# Patient Record
Sex: Female | Born: 1960 | Race: Black or African American | Hispanic: No | Marital: Single | Smoking: Former smoker
Health system: Southern US, Community
[De-identification: ages and names within clinical notes are randomized; demographics above are authoritative.]

## PROBLEM LIST (undated history)

## (undated) DIAGNOSIS — D75A Glucose-6-phosphate dehydrogenase (G6PD) deficiency without anemia: Secondary | ICD-10-CM

## (undated) HISTORY — PX: TUBAL LIGATION: SHX77

## (undated) HISTORY — PX: SINUS IRRIGATION: SHX2411

---

## 2013-05-26 ENCOUNTER — Encounter (HOSPITAL_BASED_OUTPATIENT_CLINIC_OR_DEPARTMENT_OTHER): Payer: Self-pay | Admitting: Emergency Medicine

## 2013-05-26 ENCOUNTER — Emergency Department (HOSPITAL_BASED_OUTPATIENT_CLINIC_OR_DEPARTMENT_OTHER)
Admission: EM | Admit: 2013-05-26 | Discharge: 2013-05-26 | Disposition: A | Discharge status: Home / Self Care | Payer: BC Managed Care – PPO | Attending: Emergency Medicine | Admitting: Emergency Medicine

## 2013-05-26 DIAGNOSIS — Z8639 Personal history of other endocrine, nutritional and metabolic disease: Secondary | ICD-10-CM | POA: Insufficient documentation

## 2013-05-26 DIAGNOSIS — K2990 Gastroduodenitis, unspecified, without bleeding: Principal | ICD-10-CM

## 2013-05-26 DIAGNOSIS — Z862 Personal history of diseases of the blood and blood-forming organs and certain disorders involving the immune mechanism: Secondary | ICD-10-CM | POA: Insufficient documentation

## 2013-05-26 DIAGNOSIS — Z3202 Encounter for pregnancy test, result negative: Secondary | ICD-10-CM | POA: Insufficient documentation

## 2013-05-26 DIAGNOSIS — Z79899 Other long term (current) drug therapy: Secondary | ICD-10-CM | POA: Insufficient documentation

## 2013-05-26 DIAGNOSIS — K2970 Gastritis, unspecified, without bleeding: Secondary | ICD-10-CM | POA: Insufficient documentation

## 2013-05-26 HISTORY — DX: Glucose-6-phosphate dehydrogenase (G6PD) deficiency without anemia: D75.A

## 2013-05-26 LAB — CBC WITH DIFFERENTIAL/PLATELET
Basophils Absolute: 0 10*3/uL (ref 0.0–0.1)
Basophils Relative: 0 % (ref 0–1)
Eosinophils Absolute: 0.1 10*3/uL (ref 0.0–0.7)
Eosinophils Relative: 1 % (ref 0–5)
HCT: 34.8 % — ABNORMAL LOW (ref 36.0–46.0)
HEMOGLOBIN: 11.2 g/dL — AB (ref 12.0–15.0)
LYMPHS ABS: 2.8 10*3/uL (ref 0.7–4.0)
LYMPHS PCT: 40 % (ref 12–46)
MCH: 27.7 pg (ref 26.0–34.0)
MCHC: 32.2 g/dL (ref 30.0–36.0)
MCV: 85.9 fL (ref 78.0–100.0)
MONOS PCT: 7 % (ref 3–12)
Monocytes Absolute: 0.5 10*3/uL (ref 0.1–1.0)
NEUTROS PCT: 52 % (ref 43–77)
Neutro Abs: 3.5 10*3/uL (ref 1.7–7.7)
Platelets: 315 10*3/uL (ref 150–400)
RBC: 4.05 MIL/uL (ref 3.87–5.11)
RDW: 13.4 % (ref 11.5–15.5)
WBC: 6.8 10*3/uL (ref 4.0–10.5)

## 2013-05-26 LAB — LIPASE, BLOOD: LIPASE: 27 U/L (ref 11–59)

## 2013-05-26 LAB — URINE MICROSCOPIC-ADD ON

## 2013-05-26 LAB — URINALYSIS, ROUTINE W REFLEX MICROSCOPIC
GLUCOSE, UA: NEGATIVE mg/dL
Hgb urine dipstick: NEGATIVE
Ketones, ur: NEGATIVE mg/dL
NITRITE: NEGATIVE
PH: 6 (ref 5.0–8.0)
Protein, ur: NEGATIVE mg/dL
SPECIFIC GRAVITY, URINE: 1.028 (ref 1.005–1.030)
Urobilinogen, UA: 1 mg/dL (ref 0.0–1.0)

## 2013-05-26 LAB — COMPREHENSIVE METABOLIC PANEL
ALK PHOS: 167 U/L — AB (ref 39–117)
ALT: 36 U/L — ABNORMAL HIGH (ref 0–35)
AST: 26 U/L (ref 0–37)
Albumin: 3.9 g/dL (ref 3.5–5.2)
BUN: 10 mg/dL (ref 6–23)
CO2: 26 mEq/L (ref 19–32)
Calcium: 9.4 mg/dL (ref 8.4–10.5)
Chloride: 101 mEq/L (ref 96–112)
Creatinine, Ser: 0.8 mg/dL (ref 0.50–1.10)
GFR, EST NON AFRICAN AMERICAN: 83 mL/min — AB (ref >90)
GLUCOSE: 101 mg/dL — AB (ref 70–99)
Potassium: 4.2 mEq/L (ref 3.7–5.3)
Sodium: 140 mEq/L (ref 137–147)
Total Bilirubin: 0.3 mg/dL (ref 0.3–1.2)
Total Protein: 8.3 g/dL (ref 6.0–8.3)

## 2013-05-26 LAB — PREGNANCY, URINE: Preg Test, Ur: NEGATIVE

## 2013-05-26 MED ORDER — GI COCKTAIL ~~LOC~~
30.0000 mL | Freq: Once | ORAL | Status: AC
  Administered 2013-05-26: 30 mL via ORAL
  Filled 2013-05-26: qty 30

## 2013-05-26 MED ORDER — SUCRALFATE 1 GM/10ML PO SUSP
1.0000 g | Freq: Three times a day (TID) | ORAL | Status: AC
Start: 2013-05-26 — End: ?

## 2013-05-26 NOTE — Discharge Instructions (Signed)

## 2013-05-26 NOTE — ED Notes (Signed)
Pt c/o epigastric pain with burning in esophagus x 2 weeks

## 2013-05-26 NOTE — ED Provider Notes (Signed)
CSN: [id number]     Arrival date & time 05/26/13  1857 History  This chart was scribed for Joy Velez B. Bernette MayersSheldon, MD by Shari HeritageAisha Amuda, ED Scribe. The patient was seen in room MH03/MH03. Patient's care was started at 7:26 PM.    Chief Complaint  Patient presents with  . Abdominal Pain    The history is provided by the patient. No language interpreter was used.    HPI Comments: Joy Velez is a 53 y.o. female who presents to the Emergency Department complaining of gradually worsening, moderate, intermittent burning epigastric pain for the past 2 weeks. She is also having burning pain in her esophagus. She says that pain does not radiate to her back. She has taken OTC Nexium thinking that pain was related to acid reflux, but this did not provide relief. [facility] and was instructed to increase her Nexium dose to 40 mg daily and if there was no relief to return or got the the hospital for further evaluation. She denies vomiting, diarrhea, constipation, dysuria, hematuria or urinary frequency. She has no history of gallbladder disease. Patient does not smoke.   Past Medical History  Diagnosis Date  . G6PD deficiency    Past Surgical History  Procedure Laterality Date  . Sinus irrigation    . Tubal ligation     History reviewed. No pertinent family history. History  Substance Use Topics  . Smoking status: Never Smoker   . Smokeless tobacco: Not on file  . Alcohol Use: No   OB History   Grav Para Term Preterm Abortions TAB SAB Ect Mult Living                 Review of Systems A complete 10 system review of systems was obtained and all systems are negative except as noted in the HPI and PMH.   Allergies  Asa; Ivp dye; and Sulfa antibiotics  Home Medications   Current Outpatient Rx  Name  Route  Sig  Dispense  Refill  . esomeprazole (NEXIUM) 40 MG capsule   Oral   Take 40 mg by mouth daily at 12 noon.          Triage Vitals: BP  171/100  Pulse 80  Temp(Src) 98.4 F (36.9 C) (Oral)  Resp 16  Ht 5\' 2"  (1.575 m)  Wt 200 lb (90.719 kg)  BMI 36.57 kg/m2  SpO2 100% Physical Exam  Nursing note and vitals reviewed. Constitutional: She is oriented to person, place, and time. She appears well-developed and well-nourished.  HENT:  Head: Normocephalic and atraumatic.  Eyes: EOM are normal. Pupils are equal, round, and reactive to light.  Neck: Normal range of motion. Neck supple.  Cardiovascular: Normal rate, normal heart sounds and intact distal pulses.   Pulmonary/Chest: Effort normal and breath sounds normal.  Abdominal: Bowel sounds are normal. She exhibits no distension. There is tenderness. There is no rebound and no guarding.  Mild epigastric pain.  Musculoskeletal: Normal range of motion. She exhibits no edema and no tenderness.  Neurological: She is alert and oriented to person, place, and time. She has normal strength. No cranial nerve deficit or sensory deficit.  Skin: Skin is warm and dry. No rash noted.  Psychiatric: She has a normal mood and affect.    ED Course  Procedures (including critical care time) DIAGNOSTIC STUDIES: Oxygen Saturation is 100% on room air, normal by my interpretation.    COORDINATION OF CARE: 7:26 PM- Patient informed  of current plan for treatment and evaluation and agrees with plan at this time.     Labs Review Labs Reviewed  URINALYSIS, ROUTINE W REFLEX MICROSCOPIC - Abnormal; Notable for the following:    Color, Urine AMBER (*)    APPearance CLOUDY (*)    Bilirubin Urine SMALL (*)    Leukocytes, UA TRACE (*)    All other components within normal limits  CBC WITH DIFFERENTIAL - Abnormal; Notable for the following:    Hemoglobin 11.2 (*)    HCT 34.8 (*)    All other components within normal limits  COMPREHENSIVE METABOLIC PANEL - Abnormal; Notable for the following:    Glucose, Bld 101 (*)    ALT 36 (*)    Alkaline Phosphatase 167 (*)    GFR calc non Af Amer 83  (*)    All other components within normal limits  URINE MICROSCOPIC-ADD ON - Abnormal; Notable for the following:    Squamous Epithelial / LPF FEW (*)    All other components within normal limits  PREGNANCY, URINE  LIPASE, BLOOD   Imaging Review No results found.  EKG Interpretation   None       MDM   1. Gastritis     Labs reviewed and unremarkable. Pt feeling a little better, suspect gastritis vs PUD. Less likely biliary colic given symptom duration. Will add Carafate. Advised GI followup.   I personally performed the services described in this documentation, which was scribed in my presence. The recorded information has been reviewed and is accurate.      Nate Perri B. Bernette Mayers, MD 05/26/13 2019

## 2015-07-20 ENCOUNTER — Emergency Department (INDEPENDENT_AMBULATORY_CARE_PROVIDER_SITE_OTHER): Payer: Worker's Compensation

## 2015-07-20 ENCOUNTER — Encounter: Payer: Self-pay | Admitting: Emergency Medicine

## 2015-07-20 ENCOUNTER — Emergency Department (INDEPENDENT_AMBULATORY_CARE_PROVIDER_SITE_OTHER)
Admission: EM | Admit: 2015-07-20 | Discharge: 2015-07-20 | Disposition: A | Discharge status: Home / Self Care | Payer: Worker's Compensation | Source: Home / Self Care | Attending: Family Medicine | Admitting: Family Medicine

## 2015-07-20 DIAGNOSIS — M25531 Pain in right wrist: Secondary | ICD-10-CM

## 2015-07-20 DIAGNOSIS — S63501A Unspecified sprain of right wrist, initial encounter: Secondary | ICD-10-CM

## 2015-07-20 NOTE — ED Notes (Signed)
Rt thumb and rt wrist injury from fall getting into her car yesterday. 5/10

## 2015-07-20 NOTE — Discharge Instructions (Signed)
Apply ice pack for 15 to 20 minutes, 3 to 4 times daily  Continue until pain decreases.  Wear wrist splint until followup visit.  Elevate arm/hand.  May take Aleve as needed.  Begin wrist exercises as tolerated

## 2015-07-20 NOTE — ED Provider Notes (Signed)
CSN: [id number]     Arrival date & time 07/20/15  1237 History   First MD Initiated Contact with Patient 07/20/15 1305     Chief Complaint  Patient presents with  . Fall      HPI Comments: While climbing into her car yesterday, patient fell injuring her right wrist and thumb. Patient notes that she has a G6PD deficiency.  She states that she can take Aleve without adverse effect.  Patient is a 55 y.o. female presenting with wrist pain. The history is provided by the patient.  Wrist Pain This is a new problem. The current episode started yesterday. The problem occurs constantly. The problem has not changed since onset.Exacerbated by: flexing right wrist and thumb. Nothing relieves the symptoms. She has tried nothing for the symptoms.    Past Medical History  Diagnosis Date  . G6PD deficiency [facility])    Past Surgical History  Procedure Laterality Date  . Sinus irrigation    . Tubal ligation     No family history on file. Social History  Substance Use Topics  . Smoking status: Former Games developer  . Smokeless tobacco: None  . Alcohol Use: No   OB History    No data available     Review of Systems  All other systems reviewed and are negative.   Allergies  Asa; Ivp dye; and Sulfa antibiotics  Home Medications   Prior to Admission medications   Medication Sig Start Date End Date Taking? Authorizing Provider  esomeprazole (NEXIUM) 40 MG capsule Take 40 mg by mouth daily at 12 noon.    Historical Provider, MD  sucralfate (CARAFATE) 1 GM/10ML suspension Take 10 mLs (1 g total) by mouth 4 (four) times daily -  with meals and at bedtime. 05/26/13   Susy Frizzle, MD   Meds Ordered and Administered this Visit  Medications - No data to display  BP 160/95 mmHg  Pulse 78  Temp(Src) 98.3 F (36.8 C) (Oral)  Ht  (1.549 m)  Wt 214 lb (97.07 kg)  BMI 40.46 kg/m2  SpO2 98% No data found.   Physical Exam  Constitutional: She is oriented to person, place, and time. She appears  well-developed and well-nourished. No distress.  HENT:  Head: Atraumatic.  Eyes: Pupils are equal, round, and reactive to light.  Cardiovascular: Normal heart sounds.   Pulmonary/Chest: Breath sounds normal.  Musculoskeletal:       Right wrist: She exhibits decreased range of motion, tenderness and bony tenderness. She exhibits no swelling, no crepitus, no deformity and no laceration.       Hands: Distinct tenderness to palpation over anatomic snuffbox and dorsal distal radius/ulna.  Distal neurovascular function is intact.   Neurological: She is alert and oriented to person, place, and time.  Skin: Skin is warm and dry.  Nursing note and vitals reviewed.   ED Course  Procedures none   Imaging Review Dg Wrist Complete Right  07/20/2015  CLINICAL DATA:  Acute right wrist pain and injury yesterday. Initial encounter. EXAM: RIGHT WRIST - COMPLETE 3+ VIEW COMPARISON:  None. FINDINGS: There is no evidence of fracture or dislocation. There is no evidence of arthropathy or other focal bone abnormality. Soft tissues are unremarkable. IMPRESSION: Negative. Electronically Signed   By: Harmon Pier M.D.   On: 07/20/2015 13:31      MDM   1. Right wrist sprain, initial encounter    Dispensed thumb spica splint. Apply ice pack for 15 to 20 minutes, 3 to 4  times daily  Continue until pain decreases.  Wear wrist splint until followup visit.  Elevate arm/hand.  May take Aleve as needed.  Begin wrist exercises as tolerated Followup with [facility] in 6 days.  Work restrictions:  Wellsite geologistWear wrist splint.    Lattie HawStephen A Beese, MD 07/23/15 1125

## 2015-09-19 ENCOUNTER — Emergency Department (INDEPENDENT_AMBULATORY_CARE_PROVIDER_SITE_OTHER)
Admission: EM | Admit: 2015-09-19 | Discharge: 2015-09-19 | Disposition: A | Discharge status: Home / Self Care | Payer: Worker's Compensation | Source: Home / Self Care | Attending: Family Medicine | Admitting: Family Medicine

## 2015-09-19 ENCOUNTER — Encounter: Payer: Self-pay | Admitting: *Deleted

## 2015-09-19 DIAGNOSIS — M654 Radial styloid tenosynovitis [de Quervain]: Secondary | ICD-10-CM | POA: Diagnosis not present

## 2015-09-19 MED ORDER — PREDNISONE 20 MG PO TABS: 20.0000 mg | ORAL_TABLET | Freq: Two times a day (BID) | ORAL | Status: AC

## 2015-09-19 NOTE — ED Provider Notes (Signed)
CSN: [id number]     Arrival date & time 09/19/15  1439 History   First MD Initiated Contact with Patient 09/19/15 1445     Chief Complaint  Patient presents with  . Work Related Injury      HPI Comments: Patient presents for followup of an occupational injury that occurred on 07/19/15 when she fell, injuring her right wrist.  She complains of persistent pain in her right wrist and thumb, and weakness when grasping.    Patient is a 55 y.o. female presenting with wrist pain. The history is provided by the patient.  Wrist Pain This is a recurrent problem. Episode onset: 2 months ago. The problem occurs constantly. The problem has been gradually worsening. Associated symptoms comments: Difficulty grasping . Exacerbated by: right wrist and thumb movement. Nothing relieves the symptoms. Treatments tried: wrist splint. The treatment provided mild relief.    Past Medical History  Diagnosis Date  . G6PD deficiency [facility](HCC)    Past Surgical History  Procedure Laterality Date  . Sinus irrigation    . Tubal ligation     History reviewed. No pertinent family history. Social History  Substance Use Topics  . Smoking status: Former Games developermoker  . Smokeless tobacco: None  . Alcohol Use: No   OB History    No data available     Review of Systems  All other systems reviewed and are negative.   Allergies  Asa; Ivp dye; and Sulfa antibiotics  Home Medications   Prior to Admission medications   Medication Sig Start Date End Date Taking? Authorizing Provider  esomeprazole (NEXIUM) 40 MG capsule Take 40 mg by mouth daily at 12 noon.    Historical Provider, MD  predniSONE (DELTASONE) 20 MG tablet Take 1 tablet (20 mg total) by mouth 2 (two) times daily. Take with food. 09/19/15   Lattie HawStephen A Beese, MD  sucralfate (CARAFATE) 1 GM/10ML suspension Take 10 mLs (1 g total) by mouth 4 (four) times daily -  with meals and at bedtime. 05/26/13   Susy Frizzleharles Sheldon, MD   Meds Ordered and Administered this Visit    Medications - No data to display  BP 160/87 mmHg  Pulse 69  Temp(Src) 98.3 F (36.8 C) (Oral)  Resp 16  Ht 5\' 2"  (1.575 m)  Wt 211 lb (95.709 kg)  BMI 38.58 kg/m2  SpO2 98% No data found.   Physical Exam  Constitutional: She is oriented to person, place, and time. She appears well-developed and well-nourished. No distress.  HENT:  Head: Atraumatic.  Eyes: Pupils are equal, round, and reactive to light.  Musculoskeletal:       Right wrist: She exhibits decreased range of motion and tenderness. She exhibits no swelling, no crepitus and no deformity.       Right hand: She exhibits tenderness.       Hands: Right thumb/wrist dorsally have distinct tenderness over the extensor tendons.  Palpation there during resisted extension and abduction of the thumb recreate her pain.  Distal neurovascular function is intact.    Neurological: She is alert and oriented to person, place, and time.  Skin: Skin is warm and dry. No rash noted.  Nursing note and vitals reviewed.   ED Course  Procedures none   MDM   1. De Quervain's tenosynovitis, right    Begin prednisone burst. Wear wrist brace (already has thumb spica splint at home).  Apply ice pack for 20 to 30 minutes, every 3 to 4 hours.  Continue until pain decreases.  Begin wrist and thumb exercises as tolerated. Will refer to Dr. Clementeen Graham for treatment and follow-up.    Lattie Haw, MD 09/19/15 [phone]

## 2015-09-19 NOTE — ED Notes (Signed)
Joy Velez is here today for a f/u of her workers comp injury on 07/19/2015. She still c/o RT hand/wrist pain with movement.

## 2015-09-19 NOTE — Discharge Instructions (Signed)
Wear wrist brace.  Apply ice pack for 20 to 30 minutes, every 3 to 4 hours.  Continue until pain decreases.  Begin wrist and thumb exercises as tolerated.   De Quervain Tenosynovitis Tendons attach muscles to bones. They also help with joint movements. When tendons become irritated or swollen, it is called tendinitis. The extensor pollicis brevis (EPB) tendon connects the EPB muscle to a bone that is near the base of the thumb. The EPB muscle helps to straighten and extend the thumb. De Quervain tenosynovitis is a condition in which the EPB tendon lining (sheath) becomes irritated, thickened, and swollen. This condition is sometimes called stenosing tenosynovitis. This condition causes pain on the thumb side of the back of the wrist. CAUSES Causes of this condition include:  Activities that repeatedly cause your thumb and wrist to extend.  A sudden increase in activity or change in activity that affects your wrist. RISK FACTORS: This condition is more likely to develop in:  Females.  People who have diabetes.  Women who have recently given birth.  People who are over 56 years of age.  People who do activities that involve repeated hand and wrist motions, such as tennis, racquetball, volleyball, gardening, and taking care of children.  People who do heavy labor.  People who have poor wrist strength and flexibility.  People who do not warm up properly before activities. SYMPTOMS Symptoms of this condition include:  Pain or tenderness over the thumb side of the back of the wrist when your thumb and wrist are not moving.  Pain that gets worse when you straighten your thumb or extend your thumb or wrist.  Pain when the injured area is touched.  Locking or catching of the thumb joint while you bend and straighten your thumb.  Decreased thumb motion due to pain.  Swelling over the affected area. DIAGNOSIS This condition is diagnosed with a medical history and physical exam. Your  health care provider will ask for details about your injury and ask about your symptoms. TREATMENT Treatment may include the use of icing and medicines to reduce pain and swelling. You may also be advised to wear a splint or brace to limit your thumb and wrist motion. In less severe cases, treatment may also include working with a physical therapist to strengthen your wrist and calm the irritation around your EPB tendon sheath. In severe cases, surgery may be needed. HOME CARE INSTRUCTIONS If You Have a Splint or Brace:  Wear it as told by your health care provider. Remove it only as told by your health care provider.  Loosen the splint or brace if your fingers become numb and tingle, or if they turn cold and blue.  Keep the splint or brace clean and dry. Managing Pain, Stiffness, and Swelling   If directed, apply ice to the injured area.  Put ice in a plastic bag.  Place a towel between your skin and the bag.  Leave the ice on for 20 minutes, 2-3 times per day.  Move your fingers often to avoid stiffness and to lessen swelling.  Raise (elevate) the injured area above the level of your heart while you are sitting or lying down. General Instructions  Return to your normal activities as told by your health care provider. Ask your health care provider what activities are safe for you.  Take over-the-counter and prescription medicines only as told by your health care provider.  Keep all follow-up visits as told by your health care provider. This  is important.  Do not drive or operate heavy machinery while taking prescription pain medicine. SEEK MEDICAL CARE IF:  Your pain, tenderness, or swelling gets worse, even if you have had treatment.  You have numbness or tingling in your wrist, hand, or fingers on the injured side.   This information is not intended to replace advice given to you by your health care provider. Make sure you discuss any questions you have with your health care  provider.   Document Released: 04/07/2005 Document Revised: 12/27/2014 Document Reviewed: 06/13/2014 Elsevier Interactive Patient Education [employer]

## 2019-03-14 ENCOUNTER — Other Ambulatory Visit: Payer: Self-pay

## 2019-03-14 DIAGNOSIS — Z20822 Contact with and (suspected) exposure to covid-19: Secondary | ICD-10-CM

## 2019-03-15 LAB — NOVEL CORONAVIRUS, NAA: SARS-CoV-2, NAA: NOT DETECTED

## 2019-03-16 ENCOUNTER — Telehealth: Payer: Self-pay | Admitting: General Practice

## 2019-03-16 NOTE — Telephone Encounter (Signed)
° °  Pt rec neg COVID results °

## 2020-09-03 ENCOUNTER — Emergency Department (HOSPITAL_BASED_OUTPATIENT_CLINIC_OR_DEPARTMENT_OTHER): Payer: BC Managed Care – PPO

## 2020-09-03 ENCOUNTER — Other Ambulatory Visit: Payer: Self-pay

## 2020-09-03 ENCOUNTER — Emergency Department (HOSPITAL_BASED_OUTPATIENT_CLINIC_OR_DEPARTMENT_OTHER)
Admission: EM | Admit: 2020-09-03 | Discharge: 2020-09-03 | Disposition: A | Discharge status: Home / Self Care | Payer: BC Managed Care – PPO | Attending: Emergency Medicine | Admitting: Emergency Medicine

## 2020-09-03 DIAGNOSIS — Z87891 Personal history of nicotine dependence: Secondary | ICD-10-CM | POA: Diagnosis not present

## 2020-09-03 DIAGNOSIS — M25561 Pain in right knee: Secondary | ICD-10-CM | POA: Diagnosis not present

## 2020-09-03 DIAGNOSIS — W01198A Fall on same level from slipping, tripping and stumbling with subsequent striking against other object, initial encounter: Secondary | ICD-10-CM | POA: Diagnosis not present

## 2020-09-03 NOTE — ED Notes (Signed)
PT educated on all aspects of crutch safety and care of injury retaining to the immobilizer and crutches. PT verbalized understanding of all instructions. PT had no further questions at this time and informed to return or call follow up ortho with any concerns

## 2020-09-03 NOTE — ED Provider Notes (Signed)
[facility] EMERGENCY DEPARTMENT Provider Note   CSN: [id number] Arrival date & time: 09/03/20  0865     History Chief Complaint  Patient presents with  . Knee Pain    Joy Velez is a 60 y.o. female.  HPI 60 year old female presents to the emergency department today for evaluation of right knee pain and injury.  Patient reports that last night she slipped on the floor and falling to the ground striking her right knee.  Patient reports that she has been able to bear weight minimally secondary to the pain.  No numbness or tingling.  No head injury or LOC.  She is taken no medications for symptoms prior to arrival.  The pain is worse with bending the right knee.      Past Medical History:  Diagnosis Date  . G6PD deficiency     There are no problems to display for this patient.   Past Surgical History:  Procedure Laterality Date  . SINUS IRRIGATION    . TUBAL LIGATION       OB History   No obstetric history on file.     No family history on file.  Social History   Tobacco Use  . Smoking status: Former Smoker  Substance Use Topics  . Alcohol use: No  . Drug use: No    Home Medications Prior to Admission medications   Medication Sig Start Date End Date Taking? Authorizing Provider  esomeprazole (NEXIUM) 40 MG capsule Take 40 mg by mouth daily at 12 noon.    [provider]  predniSONE (DELTASONE) 20 MG tablet Take 1 tablet (20 mg total) by mouth 2 (two) times daily. Take with food. 09/19/15   Lattie Haw, MD  sucralfate (CARAFATE) 1 GM/10ML suspension Take 10 mLs (1 g total) by mouth 4 (four) times daily -  with meals and at bedtime. 05/26/13   Pollyann Savoy, MD    Allergies    Asa [aspirin], Ivp dye [iodinated diagnostic agents], and Sulfa antibiotics  Review of Systems   Review of Systems  Constitutional: Negative for chills and fever.  HENT: Negative for congestion.   Eyes: Negative for discharge.  Respiratory: Negative for  cough.   Musculoskeletal: Positive for arthralgias, joint swelling and myalgias.  Skin: Negative for color change.  Neurological: Negative for weakness and numbness.  Psychiatric/Behavioral: Negative for confusion.    Physical Exam Updated Vital Signs BP (!) 147/124 (BP Location: Left Arm)   Pulse 63   Temp 98.1 F (36.7 C) (Oral)   Resp 20   Ht 5' 1.5" (1.562 m)   Wt 92.5 kg   SpO2 98%   BMI 37.92 kg/m   Physical Exam Vitals and nursing note reviewed.  Constitutional:      General: She is not in acute distress.    Appearance: She is well-developed. She is not ill-appearing or toxic-appearing.  HENT:     Head: Normocephalic and atraumatic.  Eyes:     General: No scleral icterus.       Right eye: No discharge.        Left eye: No discharge.  Cardiovascular:     Pulses: Normal pulses.  Pulmonary:     Effort: No respiratory distress.  Musculoskeletal:        General: Swelling and tenderness present. No deformity.     Cervical back: Normal range of motion.     Comments: Limited range of motion of the right knee secondary to pain.  Does have some  mild edema over the knee.  Tender palpation of the popliteal fossa.  No effusion appreciated.  No joint laxity with valgus and varus stress.  Patella tracks normally.  Negative anterior drawer test.  Compartments are soft.  DP pulses are 2+.  Sensation intact.  Brisk cap refill.  Patient has full range of motion of the right hip and right ankle without pain  Skin:    General: Skin is warm and dry.     Capillary Refill: Capillary refill takes less than 2 seconds.     Coloration: Skin is not pale.  Neurological:     Mental Status: She is alert.     Sensory: No sensory deficit.     Motor: No weakness.  Psychiatric:        Mood and Affect: Mood normal.        Behavior: Behavior normal.     ED Results / Procedures / Treatments   Labs (all labs ordered are listed, but only abnormal results are displayed) Labs Reviewed - No data  to display  EKG None  Radiology DG Knee Complete 4 Views Right  Result Date: 09/03/2020 CLINICAL DATA:  Post fall last evening now with posterior and lateral knee pain. EXAM: RIGHT KNEE - COMPLETE 4+ VIEW COMPARISON:  None. FINDINGS: No fracture or dislocation. No definite knee joint effusion, though note, the lateral radiograph is degraded due to obliquity. Joint spaces appear preserved. No evidence of chondrocalcinosis. Regional soft tissues appear normal. No radiopaque foreign body. IMPRESSION: No explanation for patient's right knee pain. Electronically Signed   By: Simonne Come M.D.   On: 09/03/2020 11:14    Procedures Procedures   Medications Ordered in ED Medications - No data to display  ED Course  I have reviewed the triage vital signs and the nursing notes.  Pertinent labs & imaging results that were available during my care of the patient were reviewed by me and considered in my medical decision making (see chart for details).    MDM Rules/Calculators/A&P                          Patient X-Ray negative for obvious fracture or dislocation. Pt is nvi. Pain managed in ED. Pt advised to follow up with orthopedics if symptoms persist for possibility of missed fracture diagnosis. Patient given brace while in ED, conservative therapy recommended and discussed. Patient will be dc home & is agreeable with above plan.  Final Clinical Impression(s) / ED Diagnoses Final diagnoses:  Acute pain of right knee    Rx / DC Orders ED Discharge Orders    None       Wallace Keller 09/03/20 1123    Terrilee Files, MD 09/03/20 [phone]

## 2020-09-03 NOTE — Discharge Instructions (Addendum)
Your work-up does not show any signs of a fracture of your knee.  This is likely a sprain, meniscal tear.  I would elevate and ice the area.  Motrin and Tylenol for pain.  Please follow-up with the orthopedic provider.  If symptoms worsen return to the ER peer

## 2020-09-03 NOTE — ED Triage Notes (Signed)
Pt fell last night coming into the house and injuring her rt. Knee.

## 2022-11-28 IMAGING — DX DG KNEE COMPLETE 4+V*R*
4 series · 4 of 4 positions shown · non-contrast
Comparison: None.

CLINICAL DATA: Post fall last evening now with posterior and
lateral knee pain.

EXAM:
RIGHT KNEE - COMPLETE 4+ VIEW

[knee ap]
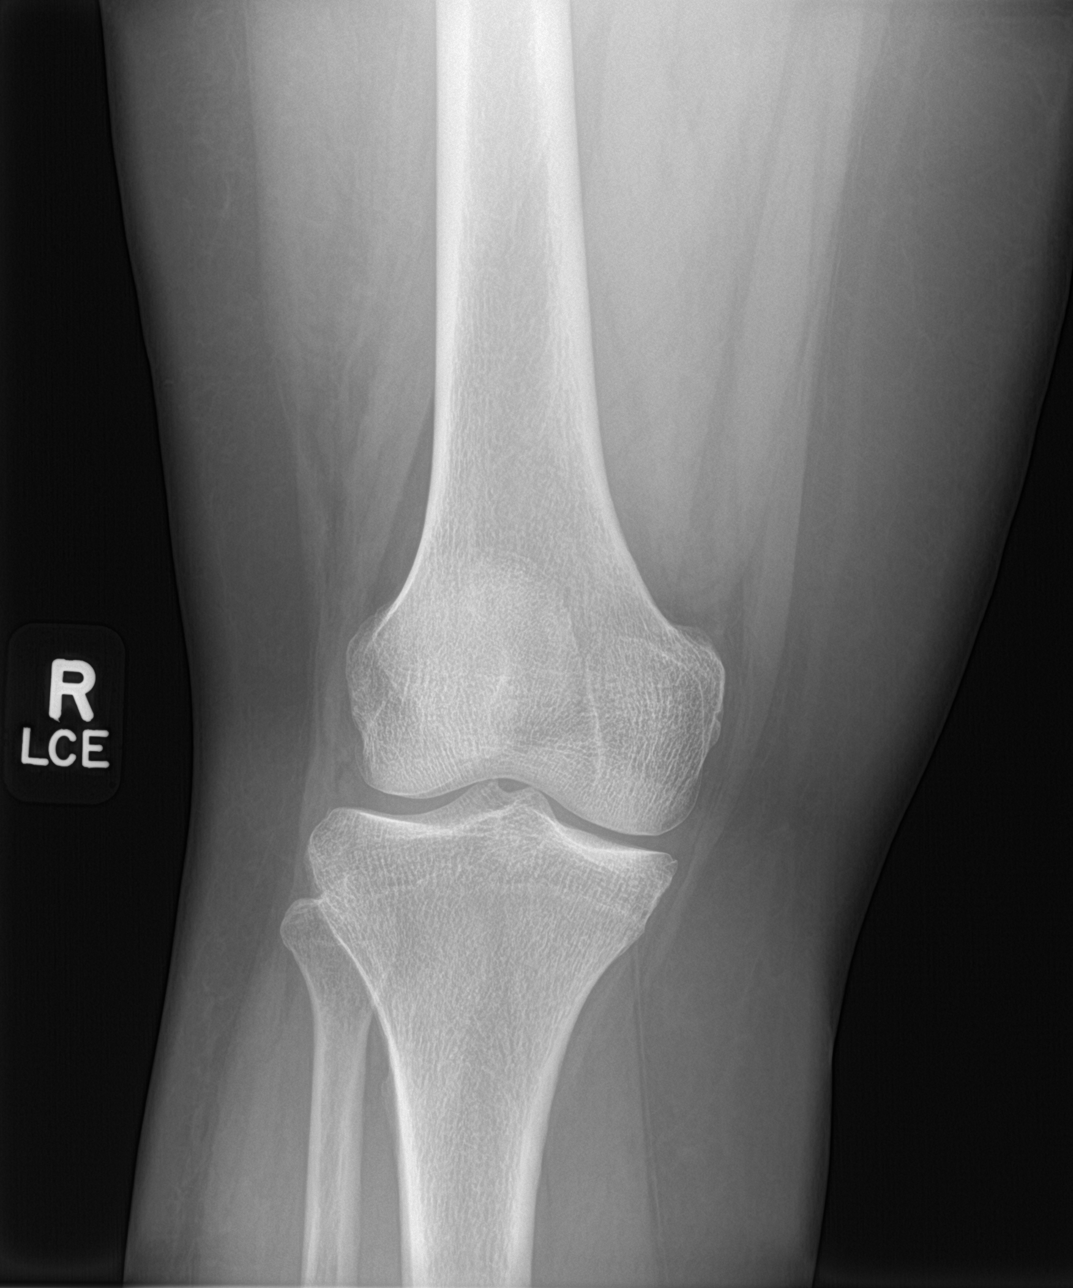

[knee lat]
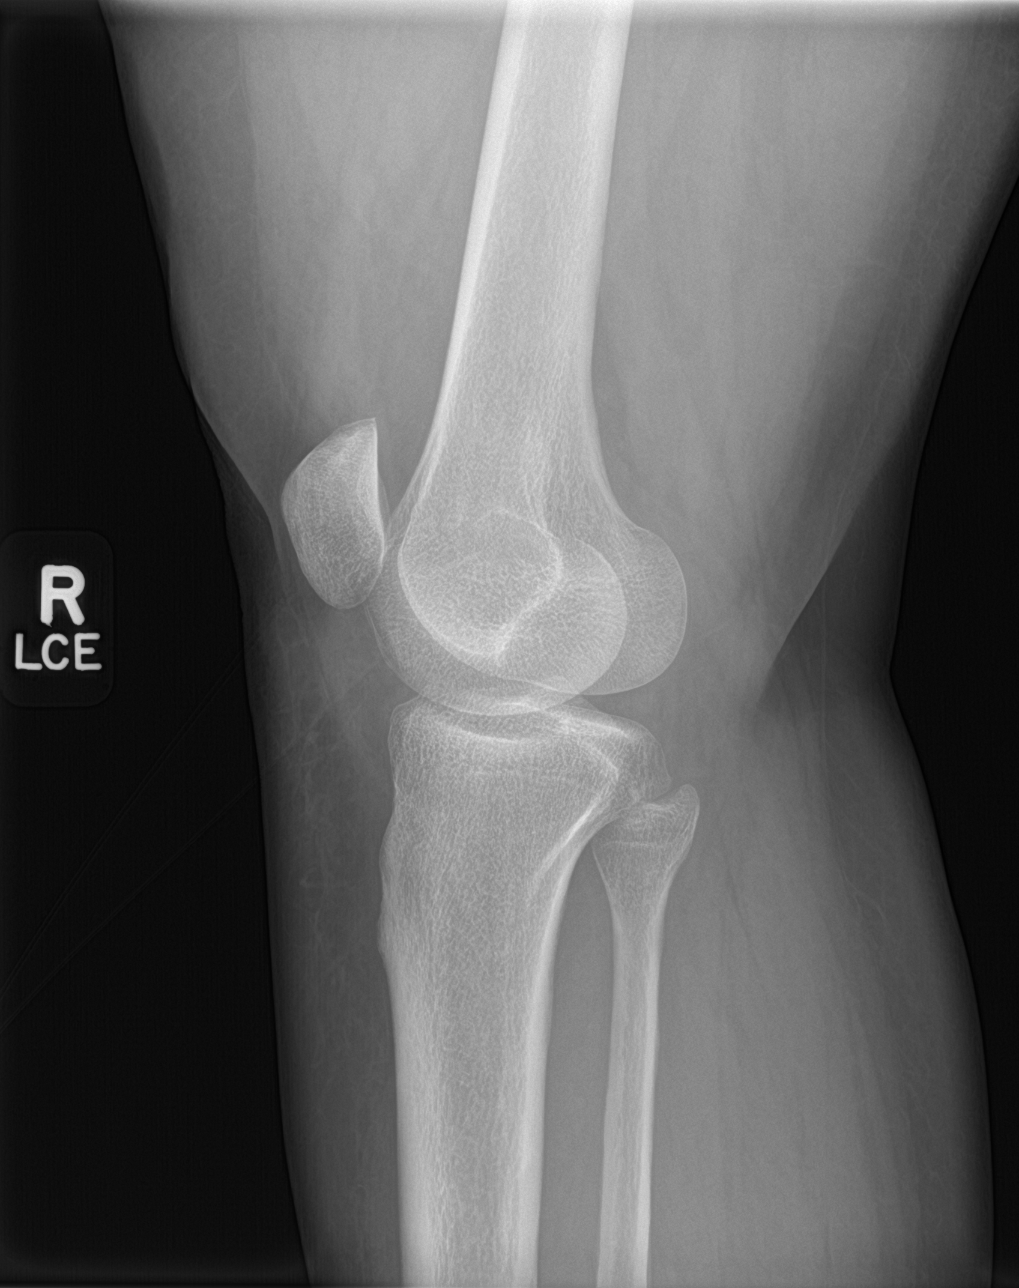

[knee obl (1 of 2)]
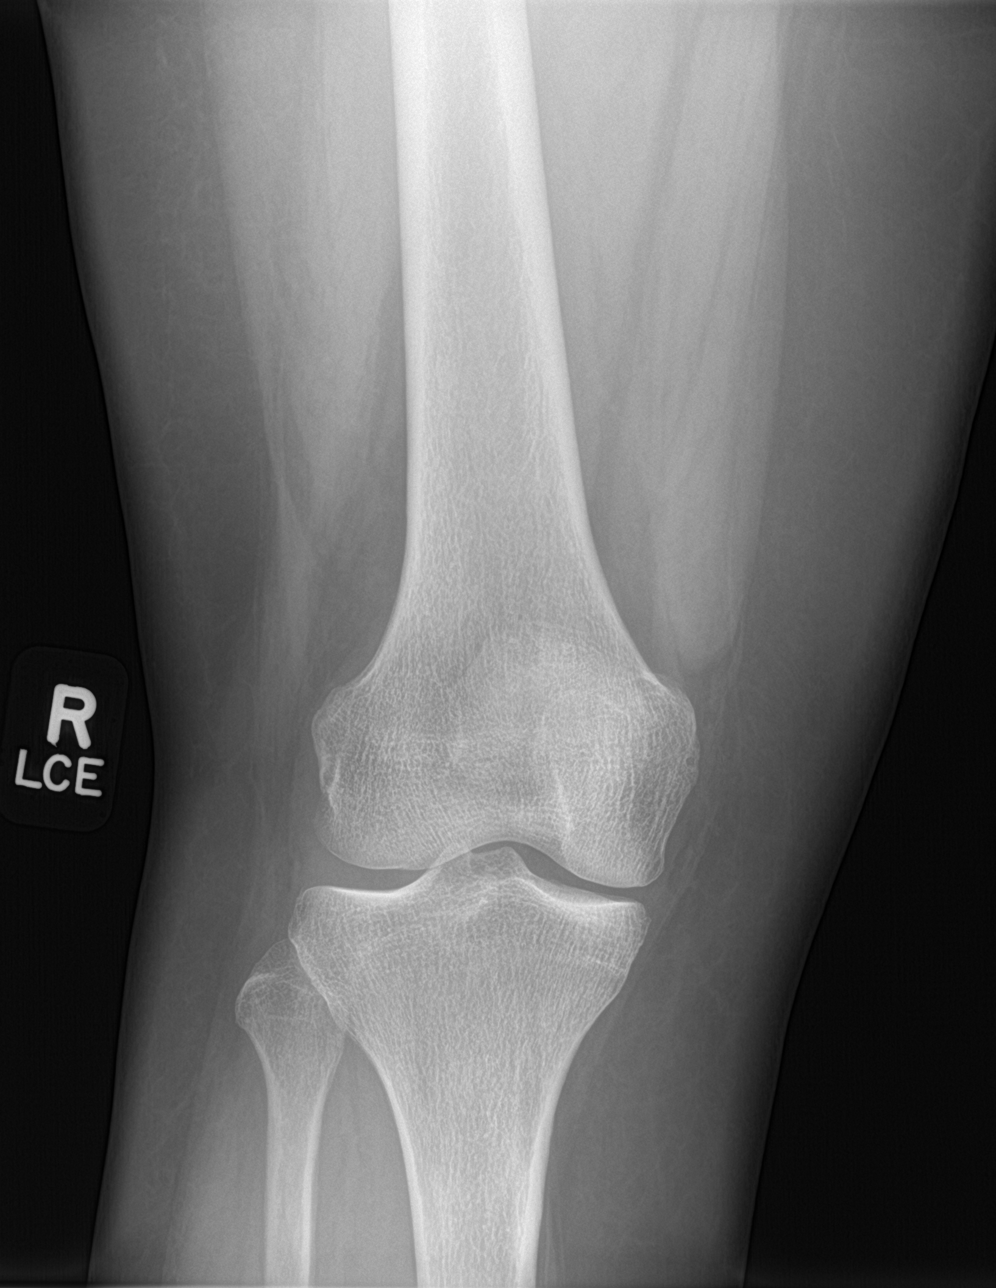

[knee obl (2 of 2)]
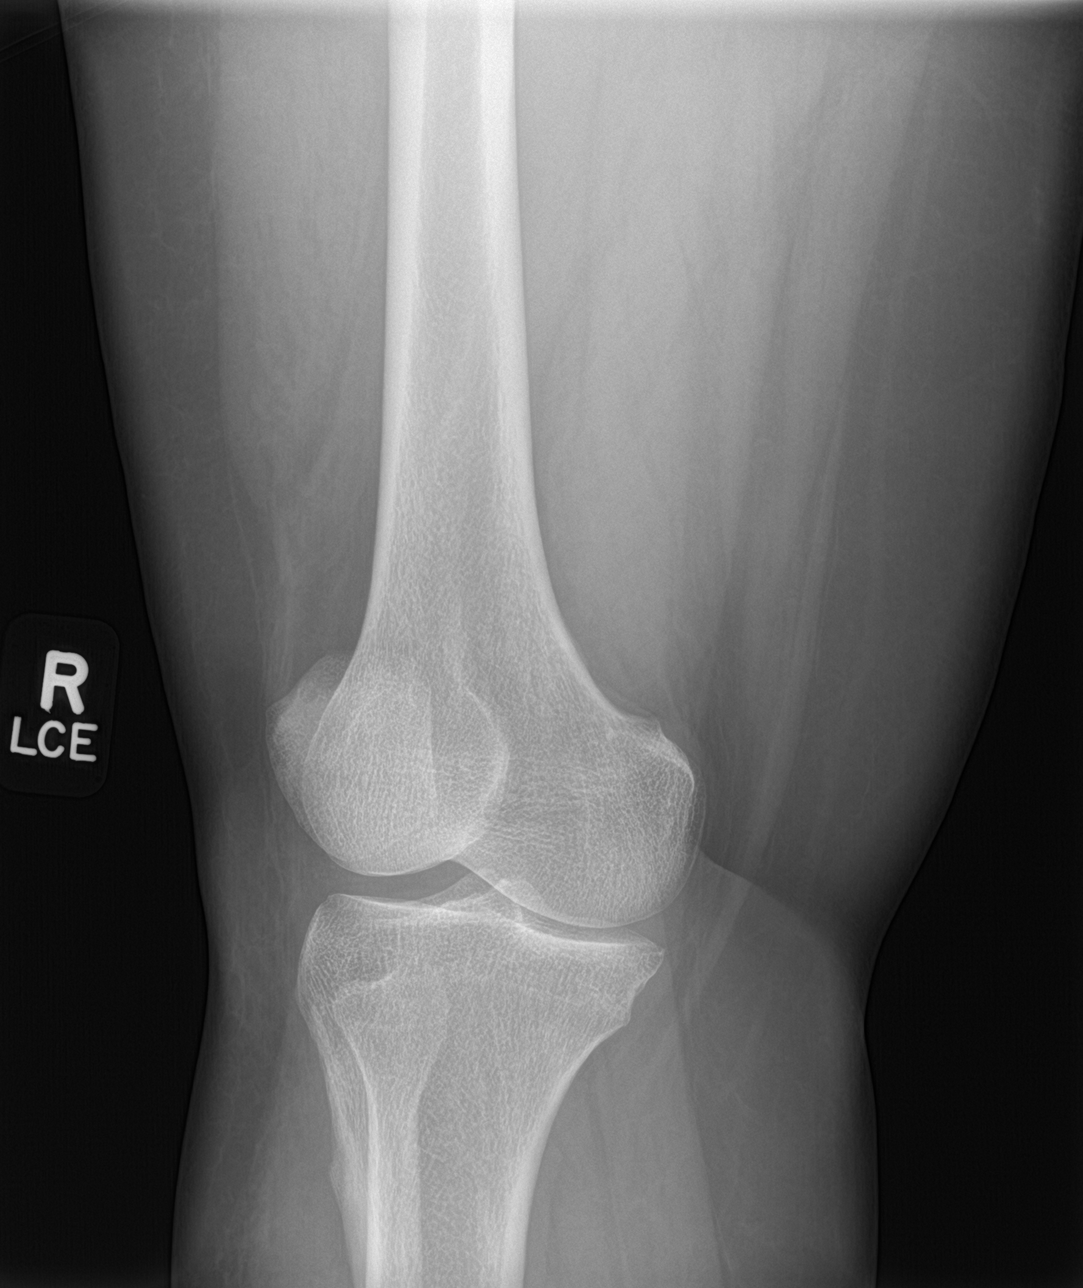

[4 of 4 positions shown; findings below may reference images not displayed]

FINDINGS: No fracture or dislocation. No definite knee joint effusion, though
note, the lateral radiograph is degraded due to obliquity.

Joint spaces appear preserved. No evidence of chondrocalcinosis.
Regional soft tissues appear normal. No radiopaque foreign body.
IMPRESSION: No explanation for patient's right knee pain.
